# Patient Record
Sex: Male | Born: 2000 | Race: White | Hispanic: No | Marital: Single | Smoking: Never smoker
Health system: Southern US, Community
[De-identification: ages and names within clinical notes are randomized; demographics above are authoritative.]

## PROBLEM LIST (undated history)

## (undated) DIAGNOSIS — K5792 Diverticulitis of intestine, part unspecified, without perforation or abscess without bleeding: Secondary | ICD-10-CM

---
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
## 2022-11-27 IMAGING — CT CT ABD-PELV W/ CM
2 of 4 series · 16 of 46 positions shown, 18 images · IV contrast (Omnipaque or Isovue)
Comparison: CT 05/25/2020, 05/24/2019

CLINICAL DATA: RLQ abdominal pain, appendicitis suspected (Age >=
14y)

EXAM:
CT ABDOMEN AND PELVIS WITH CONTRAST
TECHNIQUE: Multidetector CT imaging of the abdomen and pelvis was performed
using the standard protocol following bolus administration of
intravenous contrast.
CONTRAST:  100mL OMNIPAQUE IOHEXOL 300 MG/ML  SOLN

[Series 2: axial st · axial · 0.75mm/px · z∈[-992,-562]mm · 13 of 96 slices shown, 15 images]
[im 5/96  soft-tissue]
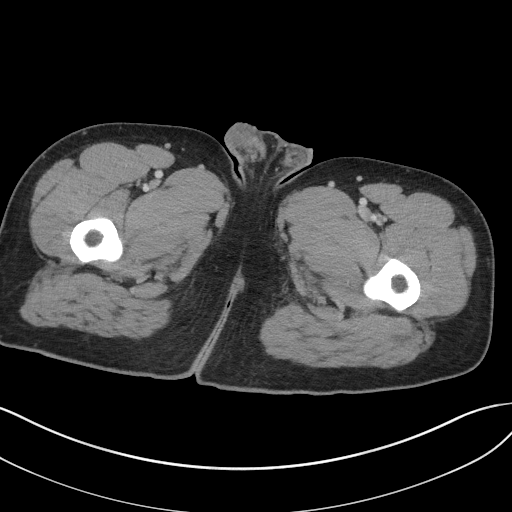
[im 5/96  bone]
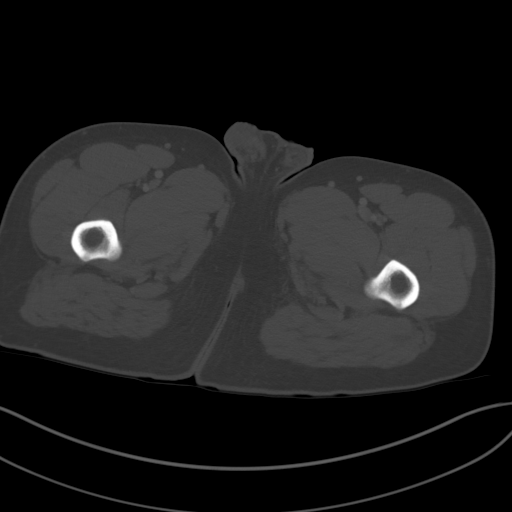
[im 14/96  soft-tissue]
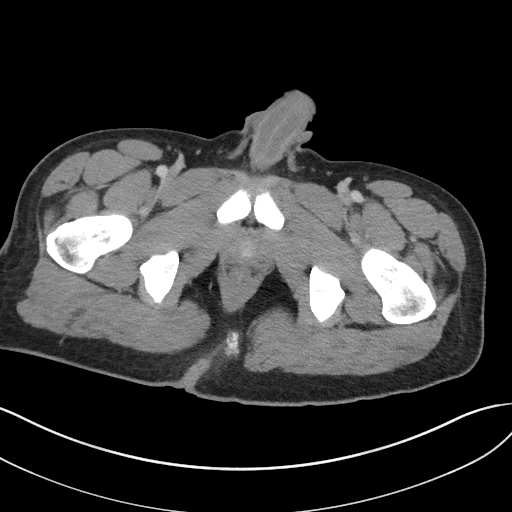
[im 19/96  soft-tissue]
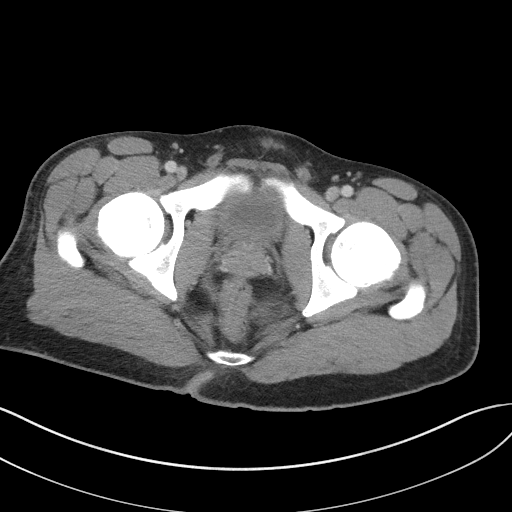
[im 28/96  soft-tissue]
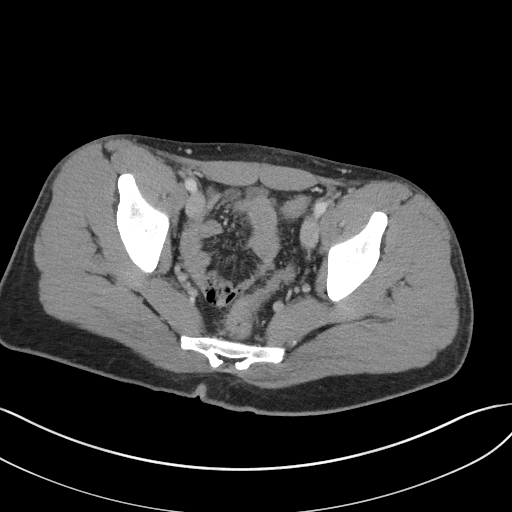
[im 32/96  soft-tissue]
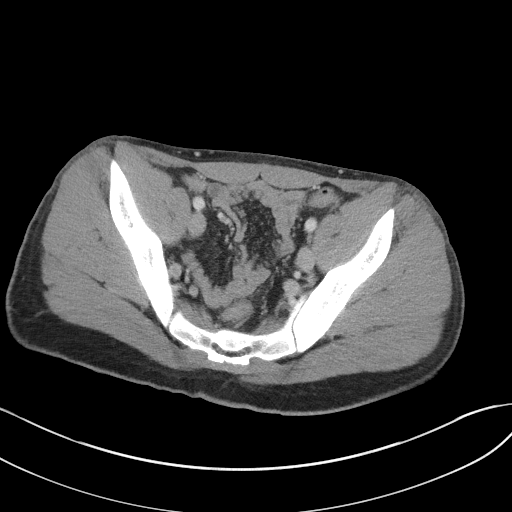
[im 41/96  soft-tissue]
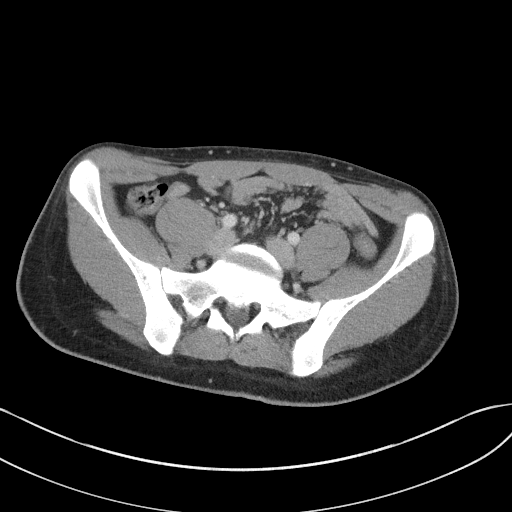
[im 50/96  soft-tissue]
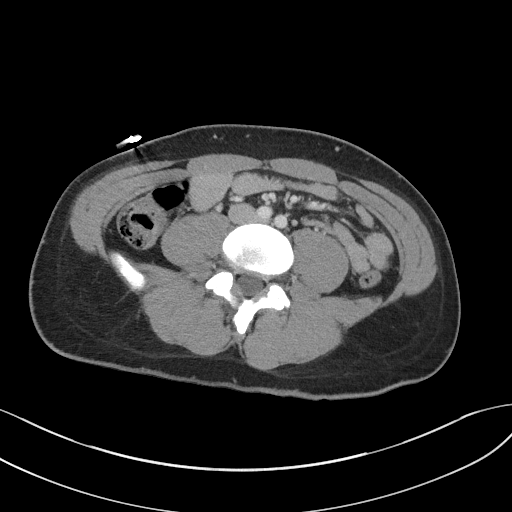
[im 55/96  soft-tissue]
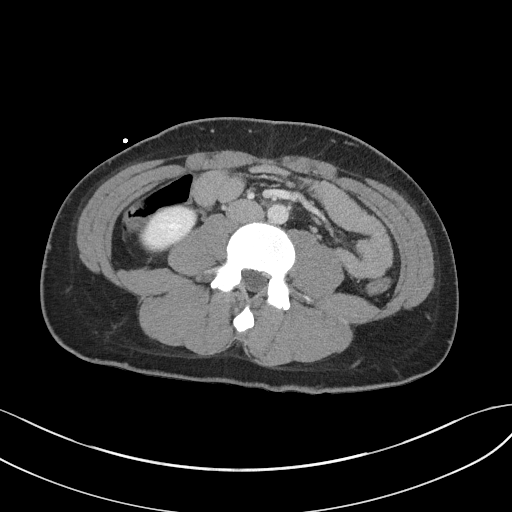
[im 64/96  soft-tissue]
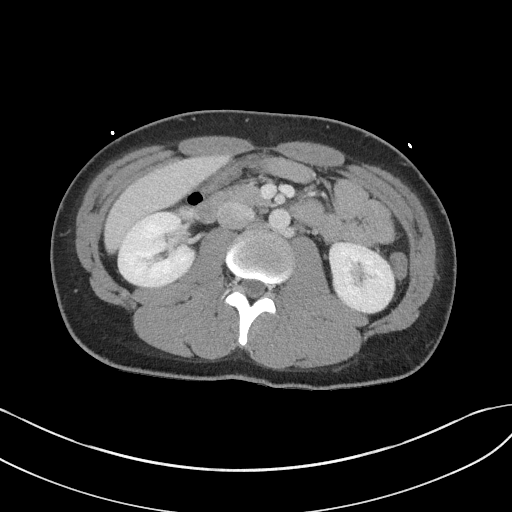
[im 64/96  bone]
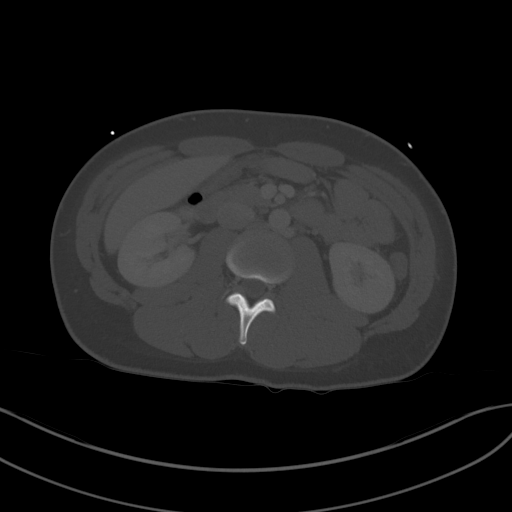
[im 68/96  soft-tissue]
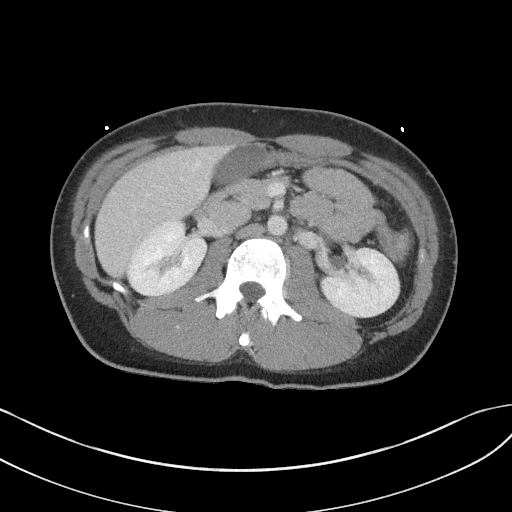
[im 77/96  soft-tissue]
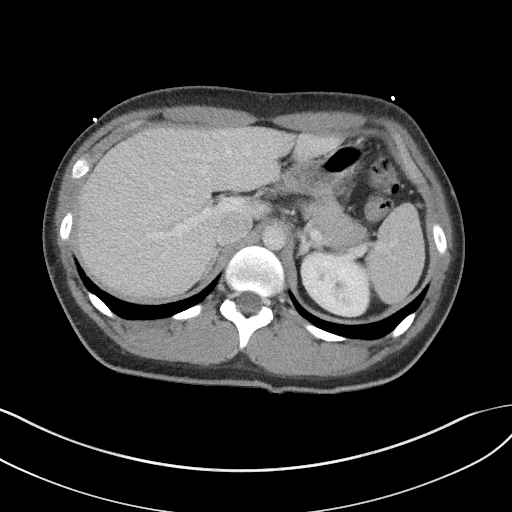
[im 82/96  soft-tissue]
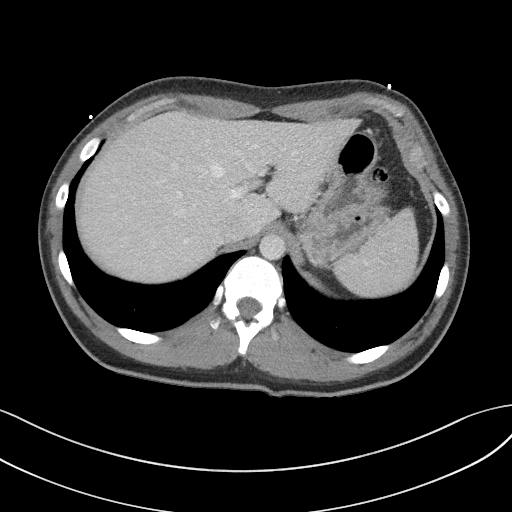
[im 91/96  soft-tissue]
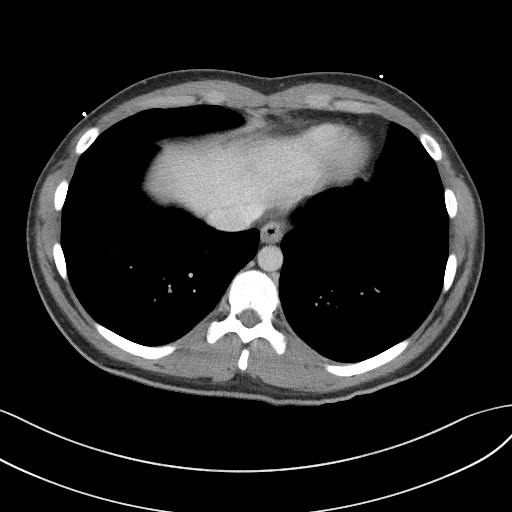

[Series 5: coronal st · coronal · 0.76mm/px · 3 of 90 slices shown]
[im 30/90  soft-tissue]
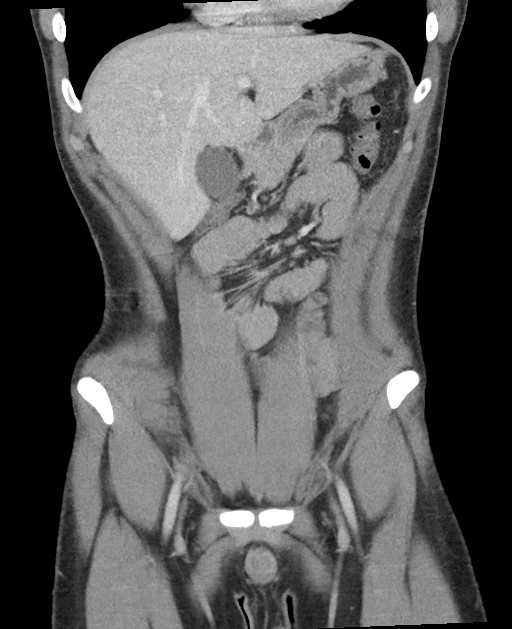
[im 40/90  soft-tissue]
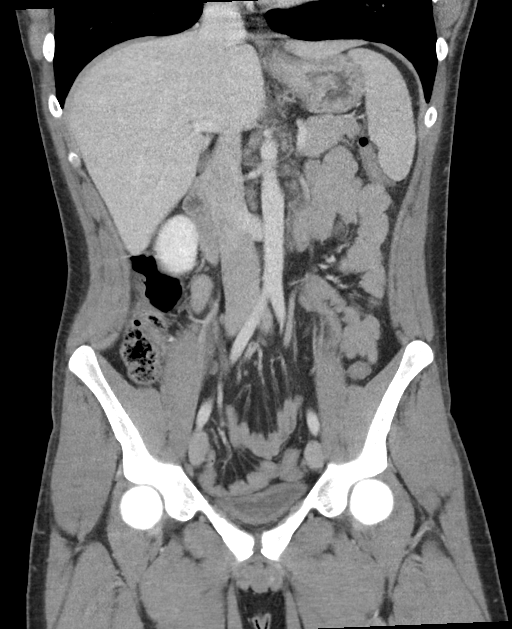
[im 50/90  soft-tissue]
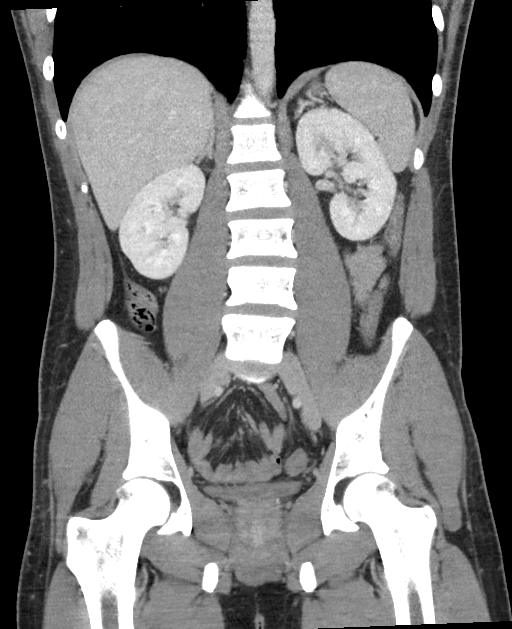

[16 of 46 positions shown; findings below may reference images not displayed]

FINDINGS: Lower chest: Lung bases are clear. No acute airspace disease or
pleural effusion.

Hepatobiliary: No focal liver abnormality is seen. No gallstones,
gallbladder wall thickening, or biliary dilatation.

Pancreas: Unremarkable. No pancreatic ductal dilatation or
surrounding inflammatory changes.

Spleen: Normal in size without focal abnormality.

Adrenals/Urinary Tract: Adrenal glands are unremarkable. Kidneys are
normal, without renal calculi, focal lesion, or hydronephrosis.
Bladder is nondistended and not well assessed.

Stomach/Bowel: Detailed bowel assessment is limited in the absence
of enteric contrast and paucity of intra-abdominal fat. The stomach
is decompressed. There is no small bowel obstruction or
inflammation. Normal air-filled appendix. No appendicitis. No
terminal ileal inflammation. Majority of the colon is decompressed.
There is no obvious colonic wall thickening or pericolonic edema

Vascular/Lymphatic: Normal caliber abdominal aorta. Patent portal
vein. Retroaortic left renal vein. No enlarged lymph nodes in the
abdomen or pelvis.

Reproductive: Prostate is unremarkable.

Other: No free air, free fluid, or intra-abdominal fluid collection.

Musculoskeletal: There are no acute or suspicious osseous
abnormalities.
IMPRESSION: No acute abnormality or explanation for abdominal pain. Normal
appendix.
